# Patient Record
Sex: Female | Born: 1960 | Race: White | Hispanic: No | Marital: Married | Smoking: Never smoker
Health system: Southern US, Community
[De-identification: ages and names within clinical notes are randomized; demographics above are authoritative.]

## PROBLEM LIST (undated history)

## (undated) DIAGNOSIS — I341 Nonrheumatic mitral (valve) prolapse: Secondary | ICD-10-CM

---

## 1999-02-20 ENCOUNTER — Other Ambulatory Visit: Admission: RE | Admit: 1999-02-20 | Discharge: 1999-02-20 | Payer: Self-pay | Admitting: Family Medicine

## 1999-12-30 ENCOUNTER — Ambulatory Visit (HOSPITAL_BASED_OUTPATIENT_CLINIC_OR_DEPARTMENT_OTHER): Admission: RE | Admit: 1999-12-30 | Discharge: 1999-12-30 | Payer: Self-pay | Admitting: Orthopedic Surgery

## 2000-01-12 ENCOUNTER — Emergency Department (HOSPITAL_COMMUNITY): Admission: EM | Admit: 2000-01-12 | Discharge: 2000-01-12 | Payer: Self-pay | Admitting: *Deleted

## 2001-03-07 ENCOUNTER — Encounter: Admission: RE | Admit: 2001-03-07 | Discharge: 2001-04-13 | Payer: Self-pay | Admitting: Internal Medicine

## 2001-06-27 ENCOUNTER — Other Ambulatory Visit: Admission: RE | Admit: 2001-06-27 | Discharge: 2001-06-27 | Payer: Self-pay | Admitting: Obstetrics and Gynecology

## 2002-12-07 ENCOUNTER — Emergency Department (HOSPITAL_COMMUNITY): Admission: EM | Admit: 2002-12-07 | Discharge: 2002-12-07 | Payer: Self-pay | Admitting: Emergency Medicine

## 2003-01-05 ENCOUNTER — Other Ambulatory Visit: Admission: RE | Admit: 2003-01-05 | Discharge: 2003-01-05 | Payer: Self-pay | Admitting: Obstetrics and Gynecology

## 2005-11-04 ENCOUNTER — Ambulatory Visit: Payer: Self-pay | Admitting: Family Medicine

## 2008-06-17 ENCOUNTER — Emergency Department (HOSPITAL_COMMUNITY): Admission: EM | Admit: 2008-06-17 | Discharge: 2008-06-17 | Payer: Self-pay | Admitting: Emergency Medicine

## 2008-06-28 ENCOUNTER — Ambulatory Visit: Payer: Self-pay | Admitting: Family Medicine

## 2008-06-28 DIAGNOSIS — N926 Irregular menstruation, unspecified: Secondary | ICD-10-CM | POA: Insufficient documentation

## 2008-07-02 LAB — CONVERTED CEMR LAB
ALT: 24 units/L (ref 0–35)
Albumin: 3.9 g/dL (ref 3.5–5.2)
Basophils Relative: 1.3 % (ref 0.0–3.0)
Chloride: 103 meq/L (ref 96–112)
Direct LDL: 154.8 mg/dL
Eosinophils Absolute: 0.2 10*3/uL (ref 0.0–0.7)
Eosinophils Relative: 3.3 % (ref 0.0–5.0)
GFR calc non Af Amer: 95 mL/min
Glucose, Bld: 75 mg/dL (ref 70–99)
HDL: 52.9 mg/dL (ref >39.0)
Lymphocytes Relative: 13.7 % (ref 12.0–46.0)
Platelets: 313 10*3/uL (ref 150–400)
Potassium: 4.8 meq/L (ref 3.5–5.1)
RBC: 4.2 M/uL (ref 3.87–5.11)
RDW: 12.1 % (ref 11.5–14.6)
Total Bilirubin: 0.8 mg/dL (ref 0.3–1.2)
Total Protein: 6.8 g/dL (ref 6.0–8.3)
Triglycerides: 159 mg/dL — ABNORMAL HIGH (ref 0–149)
VLDL: 32 mg/dL (ref 0–40)
WBC: 6.7 10*3/uL (ref 4.5–10.5)

## 2008-07-23 ENCOUNTER — Encounter: Admission: RE | Admit: 2008-07-23 | Discharge: 2008-07-23 | Payer: Self-pay | Admitting: Family Medicine

## 2008-07-25 ENCOUNTER — Other Ambulatory Visit: Admission: RE | Admit: 2008-07-25 | Discharge: 2008-07-25 | Payer: Self-pay | Admitting: Family Medicine

## 2008-07-25 ENCOUNTER — Encounter: Payer: Self-pay | Admitting: Family Medicine

## 2008-07-25 ENCOUNTER — Ambulatory Visit: Payer: Self-pay | Admitting: Family Medicine

## 2008-07-25 DIAGNOSIS — N63 Unspecified lump in unspecified breast: Secondary | ICD-10-CM

## 2008-07-25 DIAGNOSIS — D239 Other benign neoplasm of skin, unspecified: Secondary | ICD-10-CM | POA: Insufficient documentation

## 2008-08-01 ENCOUNTER — Encounter: Admission: RE | Admit: 2008-08-01 | Discharge: 2008-08-01 | Payer: Self-pay | Admitting: Family Medicine

## 2010-09-22 ENCOUNTER — Telehealth: Payer: Self-pay | Admitting: Family Medicine

## 2010-10-16 NOTE — Progress Notes (Signed)
Summary: needs rx   Phone Note Call from Patient Call back at Home Phone [phone]   Caller: Patient Call For: Judith Part MD Summary of Call: Patient has a dentist appt tomorrow and they told her to contact our office to get a rx to take before she comes. She says that she has heart valve prolapse. She needs this rx today. Patient hasn't been seen since 2009. Uses [facility].  Initial call taken by: Melody Comas,  September 22, 2010 2:08 PM  Follow-up for Phone Call        we do not do routine premedication for simple heart murmurs anymore -- that practice was stopped  what heart problem does she have?  Follow-up by: Judith Part MD,  September 22, 2010 3:18 PM  Additional Follow-up for Phone Call Additional follow up Details #1::        Left message for patient to call back. Lewanda Rife LPN  September 22, 2010 4:48 PM   She said that one of her valves was leaking, she was diagnosed with this some time ago.  Melody Comas  September 22, 2010 5:12 PM    Additional Follow-up by: Melody Comas,  September 23, 2010 8:31 AM    Additional Follow-up for Phone Call Additional follow up Details #2::    according to cardiology note from 2002 -- she had mitral valve prolapse -- and that no longer necessitates a proph abx for dental work  (only if she has had valve replacement or prev hx of infectious endocarditis) Follow-up by: Judith Part MD,  September 22, 2010 5:27 PM  Additional Follow-up for Phone Call Additional follow up Details #3:: Details for Additional Follow-up Action Taken: Patient notified.  Additional Follow-up by: Melody Comas,  September 23, 2010 8:32 AM

## 2010-11-18 ENCOUNTER — Ambulatory Visit (INDEPENDENT_AMBULATORY_CARE_PROVIDER_SITE_OTHER): Payer: BC Managed Care – PPO | Admitting: Family Medicine

## 2010-11-18 ENCOUNTER — Encounter: Payer: Self-pay | Admitting: Family Medicine

## 2010-11-18 DIAGNOSIS — N952 Postmenopausal atrophic vaginitis: Secondary | ICD-10-CM

## 2010-11-25 NOTE — Assessment & Plan Note (Signed)
Summary: vaginal burning/rbh   Vital Signs:  Patient profile:   50 year old female Height:      65 inches Weight:      184.75 pounds BMI:     30.86 Temp:     98.2 degrees F oral Pulse rate:   76 / minute Pulse rhythm:   regular BP sitting:   120 / 80  (left arm) Cuff size:   large  Vitals Entered By: Benny Lennert CMA Duncan Dull) (November 18, 2010 9:17 AM)  History of Present Illness: Chief complaint Vaginal Burning   Has noted vaginal burning and itching ongoing x 1 month... notes mainly at night. No vaginal discharge. No odor. Also noted 2 red dots on labia... slightly tender.  Thought it was soap.. but changed and no relief. No OTC meds.   No past similar issues. She is sexually active (monogomous with husband).Marland KitchenMarland KitchenNo known exposure to STD.    Problems Prior to Update: 1)  Benign Neoplasm of Skin Site Unspecified  (ICD-216.9) 2)  Routine Gynecological Examination  (ICD-V72.31) 3)  Breast Mass  (ICD-611.72) 4)  Irregular Menses  (ICD-626.4) 5)  Other Screening Mammogram  (ICD-V76.12) 6)  Health Maintenance Exam  (ICD-V70.0)  Current Medications (verified): 1)  Prenatal Vitamins .... Dauly 2)  Loestrin 1/20 (21) 1-20 Mg-Mcg Tabs (Norethindrone Acet-Ethinyl Est) .Marland Kitchen.. 1 By Mouth Once Daily As Directed  Allergies: 1)  ! Amoxicillin  Past History:  Past medical, surgical, family and social histories (including risk factors) reviewed, and no changes noted (except as noted below).  Past Medical History: Reviewed history from 06/28/2008 and no changes required. MVP Mild allergic rhinitis (11/2002) Panic Disorder  Past Surgical History: Reviewed history from 08/01/2008 and no changes required. 1998- ovarian cyst (- surgery) Tonsillectomy C-S breast cysts on mam/us 11/09  cardiol - Parachos   Family History: Reviewed history from 06/28/2008 and no changes required. mother - ? heart problem (? valvular)  father HTN MGF HTN  GF depression brother depression    Social History: Reviewed history from 06/28/2008 and no changes required. 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 CV:  Denies chest pain or discomfort. Resp:  Denies shortness of breath.  Physical Exam  General:  Well-developed,well-nourished,in no acute distress; alert,appropriate and cooperative throughout examination Mouth:  Oral mucosa and oropharynx without lesions or exudates.  Teeth in good repair. Lungs:  Normal respiratory effort, chest expands symmetrically. Lungs are clear to auscultation, no crackles or wheezes. Heart:  Normal rate and regular rhythm. S1 and S2 normal without gallop, murmur, click, rub or other extra sounds. Abdomen:  Bowel sounds positive,abdomen soft and non-tender without masses, organomegaly or hernias noted. Genitalia:  no external lesions, no vaginal discharge, and vaginal atrophy.   Begnign appearing hemmangoioma left labia majora   Impression & Recommendations:  Problem # 1:  VAGINITIS, ATROPHIC, POSTMENOPAUSAL (ICD-627.3)  no sign of infection. Most likely symptoms are from postmenopaussal status or other irritant. Treat with topical premarin cream. Follow up if not improving as expected.  Orders: Wet Prep ([address]UE)  Her updated medication list for this problem includes:    Premarin 0.625 Mg/gm Crea (Estrogens, conjugated) .Marland Kitchen... 1 gm  pv 3 times a week then decrease to lowest effective dose.. if able can stop after several weeks.  Complete Medication List: 1)  Prenatal Vitamins  .... Dauly 2)  Loestrin 1/20 (21) 1-20 Mg-mcg Tabs (Norethindrone acet-ethinyl est) .Marland Kitchen.. 1 by mouth once daily as directed 3)  Premarin 0.625 Mg/gm Crea (Estrogens, conjugated) .Marland Kitchen.. 1 gm  pv 3 times a week then decrease to lowest effective dose.. if able can stop after several  weeks.  Patient Instructions: 1)  Apply topical cream as directed.. decrease frequency of application to as little as controls symptoms. 2)  Follow up if symptoms not improving in 2 weeks.  Prescriptions: PREMARIN 0.625 MG/GM CREA (ESTROGENS, CONJUGATED) 1 gm  pv 3 times a week then decrease to lowest effective dose.. if able can stop after several weeks.  #1 x 0   Entered and Authorized by:   Kerby Nora MD   Signed by:   Kerby Nora MD on 11/18/2010   Method used:   Electronically to        [facility]* (retail)       [address]       [place], [place]  [address]       Ph: [phone]       Fax: [phone]   RxID:   [id number]    Orders Added: 1)  Est. Patient Level III [84132] 2)  Wet Prep [44010UV]    Current Allergies (reviewed today): ! AMOXICILLIN  Laboratory Results    Wet [place]/KOH Source: vag WBC/hpf none Bacteria/hpf rare Clue cells/hpf none Yeast/hpf none KOH Negative Trichomonas/hpf none

## 2011-11-19 ENCOUNTER — Emergency Department (HOSPITAL_COMMUNITY): Admission: EM | Admit: 2011-11-19 | Discharge: 2011-11-19 | Discharge status: Absent without leave | Payer: Self-pay

## 2016-10-06 ENCOUNTER — Encounter (HOSPITAL_COMMUNITY): Payer: Self-pay | Admitting: Emergency Medicine

## 2016-10-06 ENCOUNTER — Emergency Department (HOSPITAL_COMMUNITY)
Admission: EM | Admit: 2016-10-06 | Discharge: 2016-10-06 | Disposition: A | Discharge status: Home / Self Care | Payer: BLUE CROSS/BLUE SHIELD | Attending: Emergency Medicine | Admitting: Emergency Medicine

## 2016-10-06 DIAGNOSIS — S0083XA Contusion of other part of head, initial encounter: Secondary | ICD-10-CM | POA: Insufficient documentation

## 2016-10-06 DIAGNOSIS — Y939 Activity, unspecified: Secondary | ICD-10-CM | POA: Diagnosis not present

## 2016-10-06 DIAGNOSIS — Y999 Unspecified external cause status: Secondary | ICD-10-CM | POA: Insufficient documentation

## 2016-10-06 DIAGNOSIS — S0990XA Unspecified injury of head, initial encounter: Secondary | ICD-10-CM | POA: Diagnosis present

## 2016-10-06 DIAGNOSIS — Y92410 Unspecified street and highway as the place of occurrence of the external cause: Secondary | ICD-10-CM | POA: Diagnosis not present

## 2016-10-06 DIAGNOSIS — S069X9A Unspecified intracranial injury with loss of consciousness of unspecified duration, initial encounter: Secondary | ICD-10-CM | POA: Diagnosis not present

## 2016-10-06 HISTORY — DX: Nonrheumatic mitral (valve) prolapse: I34.1

## 2016-10-06 MED ORDER — CYCLOBENZAPRINE HCL 10 MG PO TABS: 10.0000 mg | ORAL_TABLET | Freq: Two times a day (BID) | ORAL | 0 refills | Status: DC | PRN
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
## 2016-10-06 NOTE — Discharge Instructions (Signed)
FILL THE PRESCRIPTION FOR FLEXERIL IF YOU HAVE MUSCULAR SORENESS THAT DEVELOPS OVER TIME. TYLENOL AND/OR IBUPROFEN IS ALSO RECOMMENDED AS NEEDED.

## 2016-11-20 ENCOUNTER — Ambulatory Visit (INDEPENDENT_AMBULATORY_CARE_PROVIDER_SITE_OTHER): Payer: BLUE CROSS/BLUE SHIELD | Admitting: Family Medicine

## 2016-11-20 ENCOUNTER — Encounter: Payer: Self-pay | Admitting: Family Medicine

## 2016-11-20 VITALS — BP 118/76 | HR 73 | Temp 98.4°F | Ht 65.5 in | Wt 196.2 lb

## 2016-11-20 DIAGNOSIS — S0990XA Unspecified injury of head, initial encounter: Secondary | ICD-10-CM | POA: Insufficient documentation

## 2016-11-20 DIAGNOSIS — G44321 Chronic post-traumatic headache, intractable: Secondary | ICD-10-CM

## 2016-11-20 DIAGNOSIS — S060X1A Concussion with loss of consciousness of 30 minutes or less, initial encounter: Secondary | ICD-10-CM | POA: Diagnosis not present

## 2016-11-20 DIAGNOSIS — S060X9A Concussion with loss of consciousness of unspecified duration, initial encounter: Secondary | ICD-10-CM | POA: Insufficient documentation

## 2016-11-20 DIAGNOSIS — R51 Headache: Secondary | ICD-10-CM

## 2016-11-20 DIAGNOSIS — R519 Headache, unspecified: Secondary | ICD-10-CM | POA: Insufficient documentation

## 2016-11-20 DIAGNOSIS — S060XAA Concussion with loss of consciousness status unknown, initial encounter: Secondary | ICD-10-CM | POA: Insufficient documentation

## 2016-11-20 NOTE — Patient Instructions (Addendum)
Avoid head injury  Rest brain (cognitive rest) as much as possible to get better faster  That means avoid screens/reading/ TV  Avoid caffeine and drink more water  It will take a while for all of your symptoms to get better  Stop at check out for CT of the head
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
## 2016-11-20 NOTE — Progress Notes (Signed)
Pre visit review using our clinic review tool, if applicable. No additional management support is needed unless otherwise documented below in the visit note. 
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
## 2016-11-25 ENCOUNTER — Ambulatory Visit (INDEPENDENT_AMBULATORY_CARE_PROVIDER_SITE_OTHER)
Admission: RE | Admit: 2016-11-25 | Discharge: 2016-11-25 | Disposition: A | Discharge status: Home / Self Care | Payer: BLUE CROSS/BLUE SHIELD | Source: Ambulatory Visit | Attending: Family Medicine | Admitting: Family Medicine

## 2016-11-25 DIAGNOSIS — G44321 Chronic post-traumatic headache, intractable: Secondary | ICD-10-CM

## 2016-11-25 DIAGNOSIS — S0990XA Unspecified injury of head, initial encounter: Secondary | ICD-10-CM

## 2016-11-25 DIAGNOSIS — R51 Headache: Secondary | ICD-10-CM | POA: Diagnosis not present

## 2018-03-11 DIAGNOSIS — H2513 Age-related nuclear cataract, bilateral: Secondary | ICD-10-CM | POA: Diagnosis not present
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
